# Patient Record
Sex: Female | Born: 1965 | Race: Black or African American | Hispanic: No | Marital: Single | Smoking: Never smoker
Health system: Southern US, Community
[De-identification: ages and names within clinical notes are randomized; demographics above are authoritative.]

---

## 2003-12-23 ENCOUNTER — Emergency Department (HOSPITAL_COMMUNITY): Admission: EM | Admit: 2003-12-23 | Discharge: 2003-12-23 | Payer: Self-pay | Admitting: Emergency Medicine

## 2003-12-27 ENCOUNTER — Emergency Department (HOSPITAL_COMMUNITY): Admission: EM | Admit: 2003-12-27 | Discharge: 2003-12-27 | Payer: Self-pay | Admitting: Emergency Medicine

## 2010-08-14 ENCOUNTER — Emergency Department (HOSPITAL_BASED_OUTPATIENT_CLINIC_OR_DEPARTMENT_OTHER)
Admission: EM | Admit: 2010-08-14 | Discharge: 2010-08-14 | Payer: Self-pay | Source: Home / Self Care | Admitting: Emergency Medicine

## 2010-08-15 ENCOUNTER — Ambulatory Visit (HOSPITAL_COMMUNITY)
Admission: RE | Admit: 2010-08-15 | Discharge: 2010-08-15 | Payer: Self-pay | Source: Home / Self Care | Attending: Emergency Medicine | Admitting: Emergency Medicine

## 2013-01-16 ENCOUNTER — Ambulatory Visit: Payer: Self-pay | Admitting: Podiatry

## 2013-01-16 DIAGNOSIS — M722 Plantar fascial fibromatosis: Secondary | ICD-10-CM | POA: Insufficient documentation

## 2013-01-16 NOTE — Progress Notes (Signed)
Encounter opened by error. Patient failed appointment.

## 2018-06-19 ENCOUNTER — Ambulatory Visit: Payer: Self-pay | Admitting: Podiatry

## 2020-06-20 ENCOUNTER — Other Ambulatory Visit: Payer: Self-pay | Admitting: Internal Medicine

## 2020-06-20 ENCOUNTER — Other Ambulatory Visit: Payer: Self-pay | Admitting: Family

## 2020-06-23 ENCOUNTER — Other Ambulatory Visit: Payer: Self-pay | Admitting: Internal Medicine

## 2020-07-15 ENCOUNTER — Other Ambulatory Visit: Payer: Self-pay | Admitting: Family Medicine

## 2020-08-25 ENCOUNTER — Other Ambulatory Visit: Payer: Self-pay | Admitting: Urology

## 2020-08-26 ENCOUNTER — Emergency Department (HOSPITAL_BASED_OUTPATIENT_CLINIC_OR_DEPARTMENT_OTHER)
Admission: EM | Admit: 2020-08-26 | Discharge: 2020-08-27 | Disposition: A | Discharge status: Home / Self Care | Payer: Managed Care, Other (non HMO) | Attending: Emergency Medicine | Admitting: Emergency Medicine

## 2020-08-26 ENCOUNTER — Encounter (HOSPITAL_BASED_OUTPATIENT_CLINIC_OR_DEPARTMENT_OTHER): Payer: Self-pay | Admitting: Emergency Medicine

## 2020-08-26 ENCOUNTER — Emergency Department (HOSPITAL_BASED_OUTPATIENT_CLINIC_OR_DEPARTMENT_OTHER): Payer: Managed Care, Other (non HMO)

## 2020-08-26 ENCOUNTER — Other Ambulatory Visit: Payer: Self-pay

## 2020-08-26 DIAGNOSIS — R079 Chest pain, unspecified: Secondary | ICD-10-CM | POA: Insufficient documentation

## 2020-08-26 DIAGNOSIS — R202 Paresthesia of skin: Secondary | ICD-10-CM | POA: Diagnosis not present

## 2020-08-26 DIAGNOSIS — R030 Elevated blood-pressure reading, without diagnosis of hypertension: Secondary | ICD-10-CM

## 2020-08-26 DIAGNOSIS — F1290 Cannabis use, unspecified, uncomplicated: Secondary | ICD-10-CM | POA: Diagnosis not present

## 2020-08-26 LAB — CBC WITH DIFFERENTIAL/PLATELET
Abs Immature Granulocytes: 0.05 10*3/uL (ref 0.00–0.07)
Basophils Absolute: 0.1 10*3/uL (ref 0.0–0.1)
Basophils Relative: 0 %
Eosinophils Absolute: 0.1 10*3/uL (ref 0.0–0.5)
Eosinophils Relative: 1 %
HCT: 37.5 % (ref 36.0–46.0)
Hemoglobin: 12 g/dL (ref 12.0–15.0)
Immature Granulocytes: 0 %
Lymphocytes Relative: 41 %
Lymphs Abs: 6.6 10*3/uL — ABNORMAL HIGH (ref 0.7–4.0)
MCH: 26.8 pg (ref 26.0–34.0)
MCHC: 32 g/dL (ref 30.0–36.0)
MCV: 83.9 fL (ref 80.0–100.0)
Monocytes Absolute: 1 10*3/uL (ref 0.1–1.0)
Monocytes Relative: 6 %
Neutro Abs: 8.5 10*3/uL — ABNORMAL HIGH (ref 1.7–7.7)
Neutrophils Relative %: 52 %
Platelets: 331 10*3/uL (ref 150–400)
RBC: 4.47 MIL/uL (ref 3.87–5.11)
RDW: 14.4 % (ref 11.5–15.5)
Smear Review: NORMAL
WBC: 16.3 10*3/uL — ABNORMAL HIGH (ref 4.0–10.5)
nRBC: 0 % (ref 0.0–0.2)

## 2020-08-26 LAB — COMPREHENSIVE METABOLIC PANEL
ALT: 22 U/L (ref 0–44)
AST: 32 U/L (ref 15–41)
Albumin: 4.2 g/dL (ref 3.5–5.0)
Alkaline Phosphatase: 117 U/L (ref 38–126)
Anion gap: 15 (ref 5–15)
BUN: 19 mg/dL (ref 6–20)
CO2: 20 mmol/L — ABNORMAL LOW (ref 22–32)
Calcium: 9 mg/dL (ref 8.9–10.3)
Chloride: 100 mmol/L (ref 98–111)
Creatinine, Ser: 1.16 mg/dL — ABNORMAL HIGH (ref 0.44–1.00)
GFR, Estimated: 56 mL/min — ABNORMAL LOW (ref >60)
Glucose, Bld: 184 mg/dL — ABNORMAL HIGH (ref 70–99)
Potassium: 4.1 mmol/L (ref 3.5–5.1)
Sodium: 135 mmol/L (ref 135–145)
Total Bilirubin: 0.4 mg/dL (ref 0.3–1.2)
Total Protein: 8.2 g/dL — ABNORMAL HIGH (ref 6.5–8.1)

## 2020-08-26 LAB — TROPONIN I (HIGH SENSITIVITY): Troponin I (High Sensitivity): 2 ng/L (ref <18)

## 2020-08-26 MED ORDER — LORAZEPAM 2 MG/ML IJ SOLN
0.5000 mg | Freq: Once | INTRAMUSCULAR | Status: AC
  Administered 2020-08-26: 0.5 mg via INTRAVENOUS
  Filled 2020-08-26: qty 1

## 2020-08-26 NOTE — Discharge Instructions (Addendum)
It was our pleasure to provide your ER care today - we hope that you feel better.  Recommend avoid using earlier edible as that be responsible for your symptoms this evening.   Follow up with primary care doctor in the coming week - also have your blood pressure rechecked as it is high tonight.   Return to ER if worse, new symptoms, increased trouble breathing, recurrent or persistent chest pain, fainting, or other concern.

## 2020-08-26 NOTE — ED Triage Notes (Signed)
Patient arrived via POV c/o chest pain, light headedness at approximately 1900 post edible use. Patient endorses anxiety and "unable to taste". Patient is AO x 4, VS with elevated HR, unstable gait.

## 2020-08-26 NOTE — ED Provider Notes (Signed)
[facility] EMERGENCY DEPARTMENT Provider Note   CSN: [id number] Arrival date & time: 08/26/20  2140     History Chief Complaint  Patient presents with  . Chest Pain    After edible    Maureen Larson is a 55 y.o. female.  Patient c/o feeling funny in chest, numb all over body, having no taste, in past couple hours. States symptoms began shortly after taking an 'edible'.  Symptoms acute onset, moderate, constant, persistent. No hx same. States felt fine, at baseline, earlier today. Had covid ~ 1 month ago, states those symptoms had previously resolved. Denies cough or sore throat. No fever or chills. No other recent exertional chest pain, no recent unusual doe/fatigue. Denies leg pain or swelling. No focal or unilateral numbness/weakness. No change in speech or vision.   The history is provided by the patient.  Chest Pain Associated symptoms: numbness   Associated symptoms: no abdominal pain, no back pain, no cough, no fever, no headache, no palpitations, no shortness of breath and no vomiting        History reviewed. No pertinent past medical history.  Patient Active Problem List   Diagnosis Date Noted  . Plantar fasciitis, bilateral 01/16/2013    History reviewed. No pertinent surgical history.   OB History   No obstetric history on file.     No family history on file.  Social History   Tobacco Use  . Smoking status: Never Smoker  . Smokeless tobacco: Never Used  Substance Use Topics  . Drug use: Yes    Types: Marijuana    Comment: Edibles    Home Medications Prior to Admission medications   Not on File    Allergies    Penicillins  Review of Systems   Review of Systems  Constitutional: Negative for chills and fever.  HENT: Negative for sore throat.   Eyes: Negative for redness.  Respiratory: Negative for cough and shortness of breath.   Cardiovascular: Positive for chest pain. Negative for palpitations and leg swelling.  Gastrointestinal:  Negative for abdominal pain, diarrhea and vomiting.  Genitourinary: Negative for flank pain.  Musculoskeletal: Negative for back pain and neck pain.  Skin: Negative for rash.  Neurological: Positive for numbness. Negative for speech difficulty and headaches.  Hematological: Does not bruise/bleed easily.  Psychiatric/Behavioral: Negative for hallucinations. The patient is nervous/anxious.     Physical Exam Updated Vital Signs BP (!) 169/90 (BP Location: Right Arm)   Pulse (!) 110   Temp 98.7 F (37.1 C) (Oral)   Resp 18   Ht 1.549 m (5\' 1" )   Wt 94.3 kg   SpO2 94%   BMI 39.30 kg/m   Physical Exam Vitals and nursing note reviewed.  Constitutional:      Appearance: Normal appearance. She is well-developed.  HENT:     Head: Atraumatic.     Nose: Nose normal.     Mouth/Throat:     Mouth: Mucous membranes are moist.     Pharynx: Oropharynx is clear.  Eyes:     General: No scleral icterus.    Conjunctiva/sclera: Conjunctivae normal.     Pupils: Pupils are equal, round, and reactive to light.  Neck:     Vascular: No carotid bruit.     Trachea: No tracheal deviation.     Comments: Thyroid not grossly enlarged to tender.  Cardiovascular:     Rate and Rhythm: Normal rate and regular rhythm.     Pulses: Normal pulses.     Heart  sounds: Normal heart sounds. No murmur heard. No friction rub. No gallop.   Pulmonary:     Effort: Pulmonary effort is normal. No respiratory distress.     Breath sounds: Normal breath sounds.  Abdominal:     General: Bowel sounds are normal. There is no distension.     Palpations: Abdomen is soft.     Tenderness: There is no abdominal tenderness.  Genitourinary:    Comments: No cva tenderness.  Musculoskeletal:        General: No swelling or tenderness.     Cervical back: Normal range of motion and neck supple. No rigidity. No muscular tenderness.     Right lower leg: No edema.     Left lower leg: No edema.  Skin:    General: Skin is warm and  dry.     Findings: No rash.  Neurological:     Mental Status: She is alert.     Comments: Anxious appearing. Mild hyperventilation. Keeps eyes closed, but will open spontaneously. Motor/sens grossly intact bil.   Psychiatric:     Comments: Anxious appearing.      ED Results / Procedures / Treatments   Labs (all labs ordered are listed, but only abnormal results are displayed) Results for orders placed or performed during the hospital encounter of 08/26/20  CBC with Differential  Result Value Ref Range   WBC 16.3 (H) 4.0 - 10.5 K/uL   RBC 4.47 3.87 - 5.11 MIL/uL   Hemoglobin 12.0 12.0 - 15.0 g/dL   HCT 97.3 53.2 - 99.2 %   MCV 83.9 80.0 - 100.0 fL   MCH 26.8 26.0 - 34.0 pg   MCHC 32.0 30.0 - 36.0 g/dL   RDW 42.6 83.4 - 19.6 %   Platelets 331 150 - 400 K/uL   nRBC 0.0 0.0 - 0.2 %   Neutrophils Relative % 52 %   Neutro Abs 8.5 (H) 1.7 - 7.7 K/uL   Lymphocytes Relative 41 %   Lymphs Abs 6.6 (H) 0.7 - 4.0 K/uL   Monocytes Relative 6 %   Monocytes Absolute 1.0 0.1 - 1.0 K/uL   Eosinophils Relative 1 %   Eosinophils Absolute 0.1 0.0 - 0.5 K/uL   Basophils Relative 0 %   Basophils Absolute 0.1 0.0 - 0.1 K/uL   RBC Morphology MORPHOLOGY UNREMARKABLE    Smear Review Normal platelet morphology    Immature Granulocytes 0 %   Abs Immature Granulocytes 0.05 0.00 - 0.07 K/uL   Reactive, Benign Lymphocytes PRESENT   Comprehensive metabolic panel  Result Value Ref Range   Sodium 135 135 - 145 mmol/L   Potassium 4.1 3.5 - 5.1 mmol/L   Chloride 100 98 - 111 mmol/L   CO2 20 (L) 22 - 32 mmol/L   Glucose, Bld 184 (H) 70 - 99 mg/dL   BUN 19 6 - 20 mg/dL   Creatinine, Ser 2.22 (H) 0.44 - 1.00 mg/dL   Calcium 9.0 8.9 - 97.9 mg/dL   Total Protein 8.2 (H) 6.5 - 8.1 g/dL   Albumin 4.2 3.5 - 5.0 g/dL   AST 32 15 - 41 U/L   ALT 22 0 - 44 U/L   Alkaline Phosphatase 117 38 - 126 U/L   Total Bilirubin 0.4 0.3 - 1.2 mg/dL   GFR, Estimated 56 (L) >60 mL/min   Anion gap 15 5 - 15  Troponin I  (High Sensitivity)  Result Value Ref Range   Troponin I (High Sensitivity) 2 <18 ng/L   DG  Chest Portable 1 View  Result Date: 08/26/2020 CLINICAL DATA:  Chest pain, lightheadedness, anxiety EXAM: PORTABLE CHEST 1 VIEW COMPARISON:  None. FINDINGS: The heart size and mediastinal contours are within normal limits. Both lungs are clear. The visualized skeletal structures are unremarkable. IMPRESSION: No active disease. Electronically Signed   By: Sharlet Salina M.D.   On: 08/26/2020 22:10     EKG EKG Interpretation  Date/Time:  Tuesday August 26 2020 21:54:02 EST Ventricular Rate:  123 PR Interval:    QRS Duration: 80 QT Interval:  313 QTC Calculation: 448 R Axis:   35 Text Interpretation: Sinus tachycardia Low voltage, precordial leads Nonspecific T wave abnormality No previous tracing Confirmed by Cathren Laine ([id number]) on 08/26/2020 10:19:37 PM   Radiology DG Chest Portable 1 View  Result Date: 08/26/2020 CLINICAL DATA:  Chest pain, lightheadedness, anxiety EXAM: PORTABLE CHEST 1 VIEW COMPARISON:  None. FINDINGS: The heart size and mediastinal contours are within normal limits. Both lungs are clear. The visualized skeletal structures are unremarkable. IMPRESSION: No active disease. Electronically Signed   By: Sharlet Salina M.D.   On: 08/26/2020 22:10    Procedures Procedures   Medications Ordered in ED Medications  LORazepam (ATIVAN) injection 0.5 mg (0.5 mg Intravenous Given 08/26/20 2202)    ED Course  I have reviewed the triage vital signs and the nursing notes.  Pertinent labs & imaging results that were available during my care of the patient were reviewed by me and considered in my medical decision making (see chart for details).    MDM Rules/Calculators/A&P                         Iv ns. Continuous pulse ox and cardiac monitoring. Stat labs.   Reviewed nursing notes and prior charts for additional history.   CXR reviewed/interpreted by me - no pna.  Labs  reviewed/interpreted by me - hgb normal.   Pt anxious. Reassurance provided. Ativan .5 mg iv.   Recheck feels improved. Initial trop normal, delta trop pending.  Signed out to Dr Read Drivers to recheck pt, check delta trop and dispo appropriately. Feel likely if patient continued improved and delta trop normal/not increasing that pt will be stable for d/c. PCP f/u re hypertension, and earlier symptoms.      Final Clinical Impression(s) / ED Diagnoses Final diagnoses:  None    Rx / DC Orders ED Discharge Orders    None       Cathren Laine, MD 08/26/20 2318

## 2020-08-27 LAB — TROPONIN I (HIGH SENSITIVITY): Troponin I (High Sensitivity): 3 ng/L (ref <18)

## 2021-01-01 LAB — HM PAP SMEAR: HM Pap smear: NEGATIVE

## 2021-04-22 LAB — HM MAMMOGRAPHY

## 2021-04-27 ENCOUNTER — Other Ambulatory Visit: Payer: Self-pay | Admitting: Obstetrics and Gynecology

## 2021-04-27 DIAGNOSIS — R928 Other abnormal and inconclusive findings on diagnostic imaging of breast: Secondary | ICD-10-CM

## 2021-04-28 ENCOUNTER — Ambulatory Visit
Admission: RE | Admit: 2021-04-28 | Discharge: 2021-04-28 | Disposition: A | Discharge status: Home / Self Care | Payer: Managed Care, Other (non HMO) | Source: Ambulatory Visit | Attending: Obstetrics and Gynecology | Admitting: Obstetrics and Gynecology

## 2021-04-28 DIAGNOSIS — R928 Other abnormal and inconclusive findings on diagnostic imaging of breast: Secondary | ICD-10-CM

## 2021-05-19 HISTORY — PX: CERVICAL BIOPSY: SHX590

## 2022-03-03 ENCOUNTER — Ambulatory Visit: Payer: Managed Care, Other (non HMO) | Admitting: Family Medicine

## 2022-03-15 LAB — VITAMIN D 25 HYDROXY (VIT D DEFICIENCY, FRACTURES): Vit D, 25-Hydroxy: 17.7

## 2022-03-15 LAB — HEMOGLOBIN A1C: Hemoglobin A1C: 6.3

## 2022-03-30 ENCOUNTER — Ambulatory Visit (INDEPENDENT_AMBULATORY_CARE_PROVIDER_SITE_OTHER): Payer: Managed Care, Other (non HMO) | Admitting: Family

## 2022-03-30 DIAGNOSIS — E669 Obesity, unspecified: Secondary | ICD-10-CM

## 2022-04-16 ENCOUNTER — Encounter: Payer: Self-pay | Admitting: Family

## 2022-04-16 ENCOUNTER — Ambulatory Visit (INDEPENDENT_AMBULATORY_CARE_PROVIDER_SITE_OTHER): Payer: Managed Care, Other (non HMO) | Admitting: Family

## 2022-04-16 ENCOUNTER — Telehealth: Payer: Self-pay | Admitting: Family

## 2022-04-16 VITALS — BP 112/70 | HR 99 | Temp 97.8°F | Ht 61.0 in | Wt 210.4 lb

## 2022-04-16 DIAGNOSIS — R7303 Prediabetes: Secondary | ICD-10-CM | POA: Diagnosis not present

## 2022-04-16 DIAGNOSIS — E669 Obesity, unspecified: Secondary | ICD-10-CM | POA: Diagnosis not present

## 2022-04-16 DIAGNOSIS — Z1211 Encounter for screening for malignant neoplasm of colon: Secondary | ICD-10-CM | POA: Diagnosis not present

## 2022-04-16 NOTE — Telephone Encounter (Signed)
Please let her know that we did get her labs back from her GYN;  She is pre-diabetic and I do think the weight loss program will be beneficial for her. We could consider trial of Metformin to help with her blood sugar and try to jump start her weight loss goals.  If for some reason she does not start the weight loss program, would recommend follow up in 4-6 months.

## 2022-04-16 NOTE — Progress Notes (Signed)
  Maureen Larson is a 56 y.o. female with the following history as recorded in Epic[facility]:  Patient Active Problem List   Diagnosis Date Noted   Plantar fasciitis, bilateral 01/16/2013    Current Outpatient Medications  Medication Sig Dispense Refill   Multiple Vitamin (MULTIVITAMIN) capsule Take 1 capsule by mouth daily.     Vitamin D, Ergocalciferol, (DRISDOL) 1.25 MG (50000 UNIT) CAPS capsule Take 50,000 Units by mouth once a week.     No current facility-administered medications for this visit.    Allergies: Penicillins  No past medical history on file.  No past surgical history on file.  No family history on file.  Social History   Tobacco Use   Smoking status: Never   Smokeless tobacco: Never  Substance Use Topics   Alcohol use: Not on file    Subjective:   Presents today as a new patient; concerned about her weight; notes that GYN told her in the last month that she was pre-diabetic- does not have her labs available today;  Works 2 jobs- FT at [employer]; [employer] 7-10:30;  May get 7 hours sleep/ night; admits that diet is not "good." Not able to exercise due to extended work hours;      Objective:  Vitals:   04/16/22 1031  BP: 112/70  Pulse: 99  Temp: 97.8 F (36.6 C)  TempSrc: Oral  SpO2: 98%  Weight: 210 lb 6.4 oz (95.4 kg)  Height: 5\' 1"  (1.549 m)    General: Well developed, well nourished, in no acute distress  Skin : Warm and dry.  Head: Normocephalic and atraumatic  Eyes: Sclera and conjunctiva clear; pupils round and reactive to light; extraocular movements intact  Ears: External normal; canals clear; tympanic membranes normal  Oropharynx: Pink, supple. No suspicious lesions  Neck: Supple without thyromegaly, adenopathy  Lungs: Respirations unlabored; clear to auscultation bilaterally without wheeze, rales, rhonchi  CVS exam: normal rate and regular rhythm.  Neurologic: Alert and oriented; speech intact; face symmetrical; moves all  extremities well; CNII-XII intact without focal deficit   Assessment:  1. Obesity, Class II, BMI 35-39.9   2. Pre-diabetes   3. Encounter for screening colonoscopy     Plan:   Refer to healthy weight and wellness- with patient's work schedule, she would benefit from specialized eating plan/ support; will get labs from recent GYN visit- to consider trial of Metformin; follow up to be determined;  Order for screening mammogram updated;   No follow-ups on file.  Orders Placed This Encounter  Procedures   Amb Ref to Medical Weight Management    Referral Priority:   Routine    Referral Type:   Consultation    Number of Visits Requested:   1   Ambulatory referral to Gastroenterology    Referral Priority:   Routine    Referral Type:   Consultation    Referral Reason:   Specialty Services Required    Number of Visits Requested:   1    Requested Prescriptions    No prescriptions requested or ordered in this encounter

## 2022-04-16 NOTE — Progress Notes (Unsigned)
Labs, Mammogram, and  PAP has been abstracted.

## 2022-04-16 NOTE — Telephone Encounter (Signed)
I have called the pt and confirmed that she does want to try the weight loss program. She stated understanding about the other results.

## 2022-04-19 NOTE — Progress Notes (Signed)
This is addendum to note for 04/16/2022

## 2022-05-18 ENCOUNTER — Encounter (INDEPENDENT_AMBULATORY_CARE_PROVIDER_SITE_OTHER): Payer: Self-pay

## 2022-06-15 ENCOUNTER — Ambulatory Visit: Payer: Managed Care, Other (non HMO) | Admitting: Nurse Practitioner

## 2022-06-22 ENCOUNTER — Encounter: Payer: Self-pay | Admitting: Gastroenterology

## 2022-07-29 ENCOUNTER — Ambulatory Visit: Payer: Managed Care, Other (non HMO) | Admitting: *Deleted

## 2022-07-29 VITALS — Ht 61.0 in | Wt 220.0 lb

## 2022-07-29 DIAGNOSIS — Z1211 Encounter for screening for malignant neoplasm of colon: Secondary | ICD-10-CM

## 2022-07-29 MED ORDER — NA SULFATE-K SULFATE-MG SULF 17.5-3.13-1.6 GM/177ML PO SOLN: 1.0000 | Freq: Once | ORAL | 0 refills | Status: AC

## 2022-07-29 NOTE — Progress Notes (Signed)
No egg or soy allergy known to patient  No issues moving head or neck No issues with swallowing No FH of Malignant Hyperthermia Pt is not on diet pills Pt is not on  home 02  Pt is not on blood thinners  Pt denies issues with constipation  Pt is not on dialysis Pt denies any upcoming cardiac testing Pt encouraged to use to use Singlecare or Goodrx to reduce cost  Patient's chart reviewed by Osvaldo Angst CNRA prior to previsit and patient appropriate for the [place].  Previsit completed and red dot placed by patient's name on their procedure day (on provider's schedule).  . Visit done by phone Instructions sent by mail

## 2022-08-13 ENCOUNTER — Encounter: Payer: Self-pay | Admitting: Gastroenterology

## 2022-08-20 ENCOUNTER — Encounter: Payer: Managed Care, Other (non HMO) | Admitting: Gastroenterology

## 2022-08-31 ENCOUNTER — Encounter: Payer: Self-pay | Admitting: Family Medicine

## 2022-08-31 ENCOUNTER — Ambulatory Visit (INDEPENDENT_AMBULATORY_CARE_PROVIDER_SITE_OTHER): Payer: Managed Care, Other (non HMO) | Admitting: Family Medicine

## 2022-08-31 VITALS — BP 136/79 | HR 113 | Temp 98.5°F | Resp 16 | Ht 61.0 in | Wt 206.8 lb

## 2022-08-31 DIAGNOSIS — Z1152 Encounter for screening for COVID-19: Secondary | ICD-10-CM | POA: Diagnosis not present

## 2022-08-31 DIAGNOSIS — J069 Acute upper respiratory infection, unspecified: Secondary | ICD-10-CM

## 2022-08-31 LAB — POC COVID19 BINAXNOW: SARS Coronavirus 2 Ag: NEGATIVE

## 2022-08-31 NOTE — Progress Notes (Signed)
Acute Office Visit  Subjective:     Patient ID: Maureen Larson, female    DOB: [birth year], 57 y.o.   MRN: [id number]  Chief Complaint  Patient presents with   Cough    dry   Nasal Congestion     Patient is in today for URI symptoms.   Patient states that on Friday 2/9 she started with sore/dry throat. She tried hot tea and cough drops. The next morning she woke up with a dry cough. She went to urgent care and they sent of COVID/Flu PCR which came back negative the following day. States she is still struggling with productive cough with clear sputum, postnasal drainage, rhinorrhea, ear discomfort, headaches, back soreness with coughing. She denies any fevers, chills, body aches, nausea, vomiting, diarrhea, GU changes, chest pain, dyspnea, sinus pressure. She has been getting minimal improvement with promethazine DM and tessalon.      All review of systems negative except what is listed in the HPI      Objective:    BP 136/79   Pulse (!) 113   Temp 98.5 F (36.9 C)   Resp 16   Ht 5' 1"$  (1.549 m)   Wt 206 lb 12.8 oz (93.8 kg)   SpO2 98%   BMI 39.07 kg/m    Physical Exam Vitals reviewed.  Constitutional:      Appearance: Normal appearance.  HENT:     Head: Normocephalic and atraumatic.     Right Ear: Tympanic membrane normal.     Left Ear: Tympanic membrane normal.     Nose: Congestion present.     Mouth/Throat:     Mouth: Mucous membranes are moist.     Pharynx: Oropharynx is clear. No oropharyngeal exudate or posterior oropharyngeal erythema.  Cardiovascular:     Rate and Rhythm: Normal rate and regular rhythm.     Pulses: Normal pulses.     Heart sounds: Normal heart sounds.  Pulmonary:     Effort: Pulmonary effort is normal.     Breath sounds: Normal breath sounds.  Skin:    General: Skin is warm and dry.  Neurological:     Mental Status: She is alert and oriented to person, place, and time.  Psychiatric:        Mood and Affect: Mood normal.         Behavior: Behavior normal.        Thought Content: Thought content normal.        Judgment: Judgment normal.     Results for orders placed or performed in visit on 08/31/22  POC COVID-19  Result Value Ref Range   SARS Coronavirus 2 Ag Negative Negative        Assessment & Plan:   Problem List Items Addressed This Visit   None Visit Diagnoses     Viral URI with cough    -  Primary   Encounter for screening for COVID-19       Relevant Orders   POC COVID-19 (Completed)     COVID negative. Likely another virus.  Continue supportive measures including rest, hydration, humidifier use, steam showers, warm compresses to sinuses, warm liquids with lemon and honey, and over-the-counter cough, cold, and analgesics as needed.   Please contact office for follow-up if symptoms do not improve or worsen. Seek emergency care if symptoms become severe.    No orders of the defined types were placed in this encounter.   Return if symptoms worsen or fail to improve.  Lovena Le  Barbie Banner, NP

## 2022-08-31 NOTE — Patient Instructions (Addendum)
COVID negative. Likely another virus.  Continue supportive measures including rest, hydration, humidifier use, steam showers, warm compresses to sinuses, warm liquids with lemon and honey, and over-the-counter cough, cold, and analgesics as needed.   Please contact office for follow-up if symptoms do not improve or worsen. Seek emergency care if symptoms become severe.    Over the counter medications that may be helpful for symptoms:  Guaifenesin 1200 mg extended release tabs twice daily, with plenty of water For cough and congestion Brand name: Mucinex   Pseudoephedrine 30 mg, one or two tabs every 4 to 6 hours For sinus congestion Brand name: Sudafed You must get this from the pharmacy counter.  Oxymetazoline nasal spray each morning, one spray in each nostril, for NO MORE THAN 3 days  For nasal and sinus congestion Brand name: Afrin Saline nasal spray or Saline Nasal Irrigation 3-5 times a day For nasal and sinus congestion Brand names: Ocean or AYR Fluticasone nasal spray, one spray in each nostril, each morning (after oxymetazoline and saline, if used) For nasal and sinus congestion Brand name: Flonase Warm salt water gargles  For sore throat Every few hours as needed Alternate ibuprofen 400-600 mg and acetaminophen 1000 mg every 4-6 hours For fever, body aches, headache Brand names: Motrin or Advil and Tylenol Dextromethorphan 12-hour cough version 30 mg every 12 hours  For cough Brand name: Delsym Stop all other cold medications for now (Nyquil, Dayquil, Tylenol Cold, Theraflu, etc) and other non-prescription cough/cold preparations. Many of these have the same ingredients listed above and could cause an overdose of medication.   Herbal treatments that have been shown to be helpful in some patients include: Vitamin C 1086m per day Vitamin D 4000iU per day Zinc 1063mper day Quercetin 25-50064mwice a day Melatonin 5-10mg at bedtime  General Instructions Allow your  body to rest Drink PLENTY of fluids Isolate yourself from everyone, even family, until test results have returned   If you develop severe shortness of breath, uncontrolled fevers, coughing up blood, confusion, chest pain, or signs of dehydration (such as significantly decreased urine amounts or dizziness with standing) please go to the ER.

## 2022-09-05 IMAGING — MG MM DIGITAL DIAGNOSTIC UNILAT*L* W/ TOMO W/ CAD
4 series · 4 of 12 positions shown · non-contrast
Comparison: Previous exam(s).
COMPARISON: Previous exam(s).

Addendum:
CLINICAL DATA: Screening recall for a left breast asymmetry.

EXAM:
DIGITAL DIAGNOSTIC UNILATERAL LEFT MAMMOGRAM WITH TOMOSYNTHESIS AND
CAD; ULTRASOUND LEFT BREAST LIMITED
TECHNIQUE: Left digital diagnostic mammography and breast tomosynthesis was
performed. The images were evaluated with computer-aided detection.;
Targeted ultrasound examination of the left breast was performed.

[L CC synth-2D]
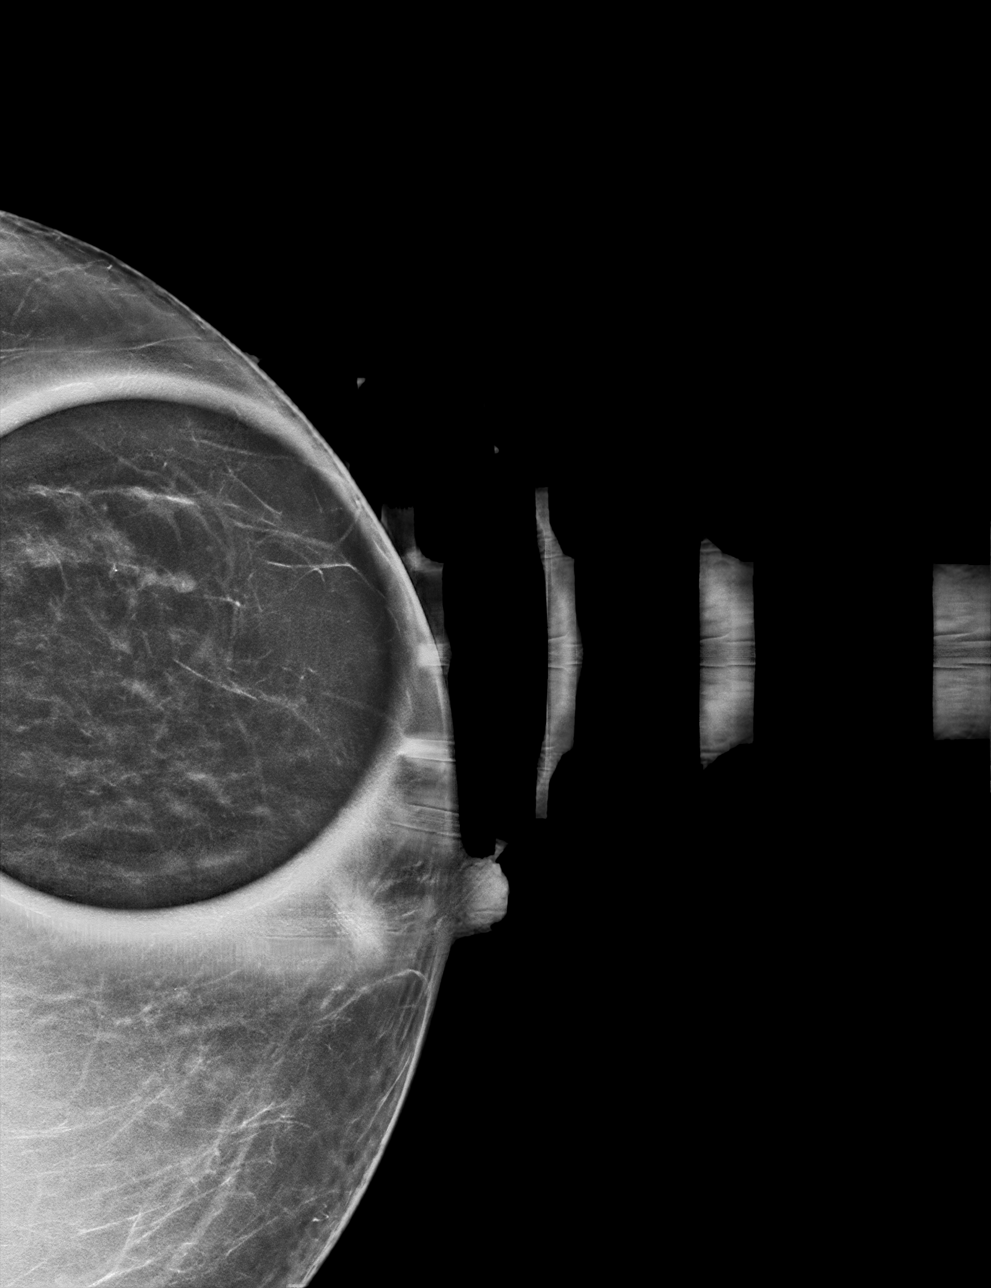

[L MLO synth-2D]
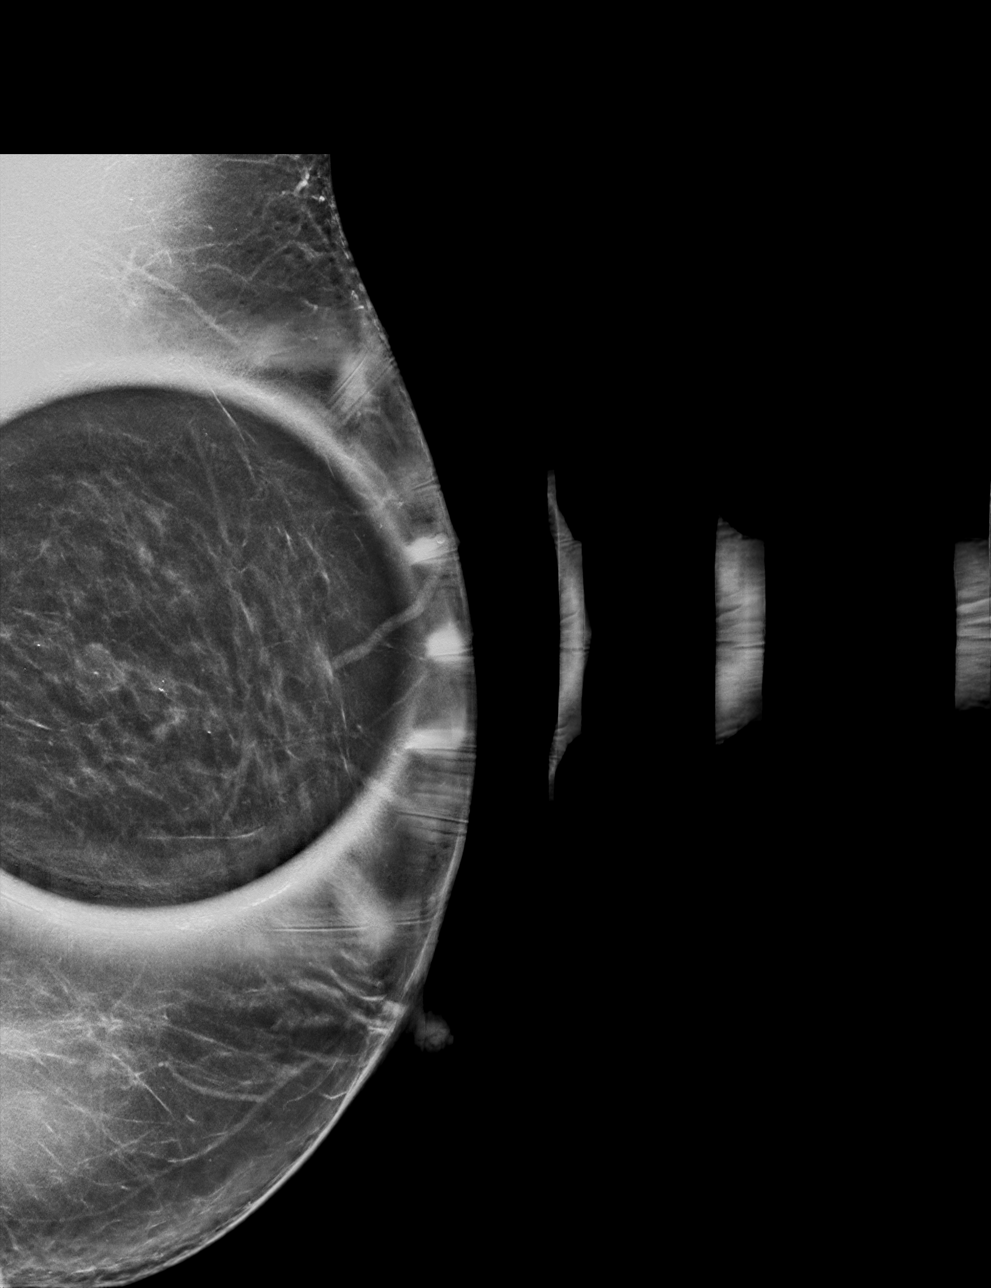

[L MLO tomo · tomo slice 39/76.0]
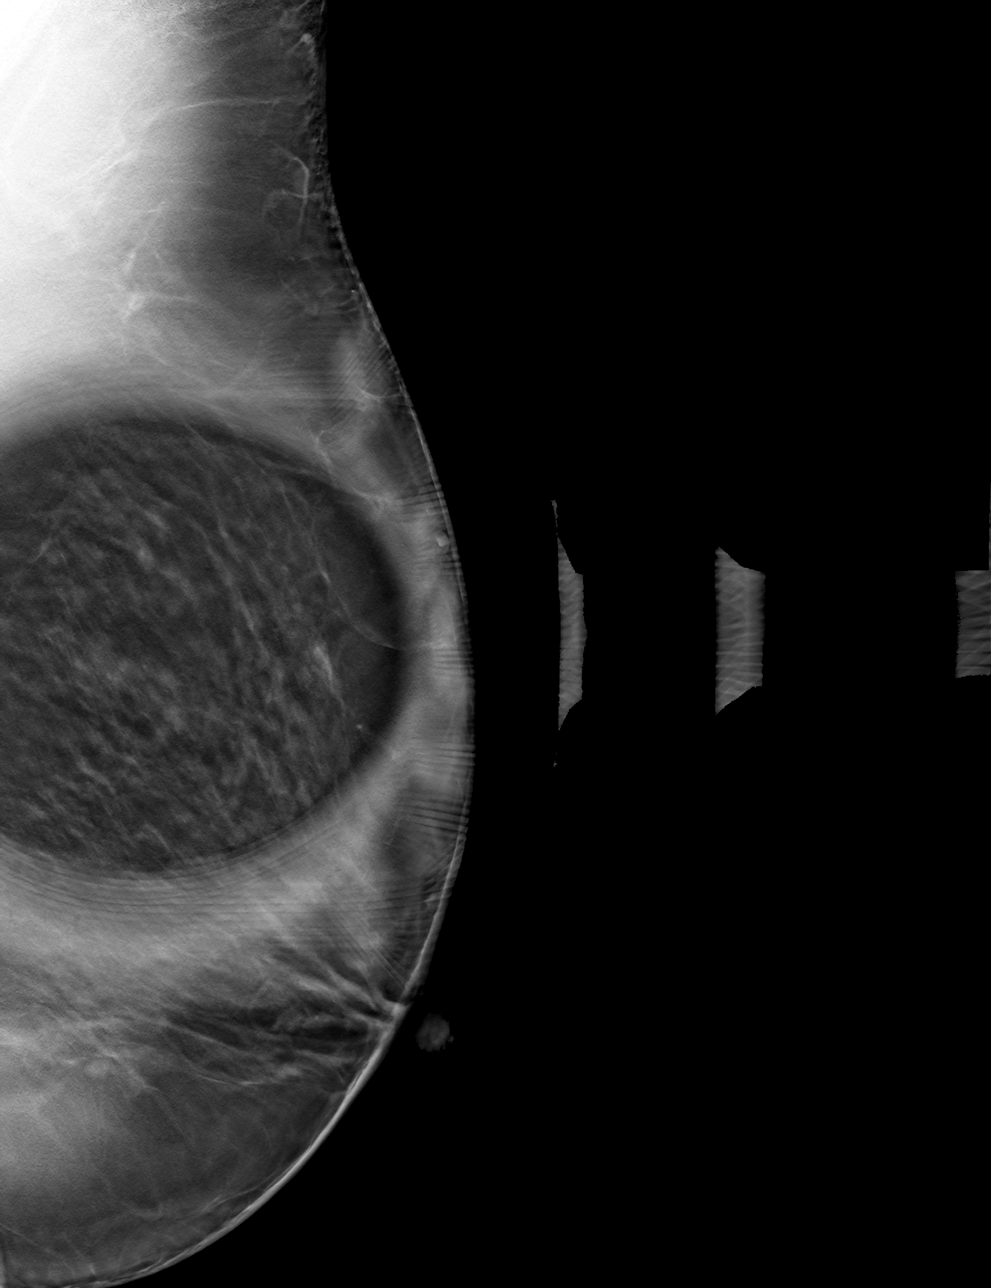

[L CC tomo · tomo slice 33/66.0]
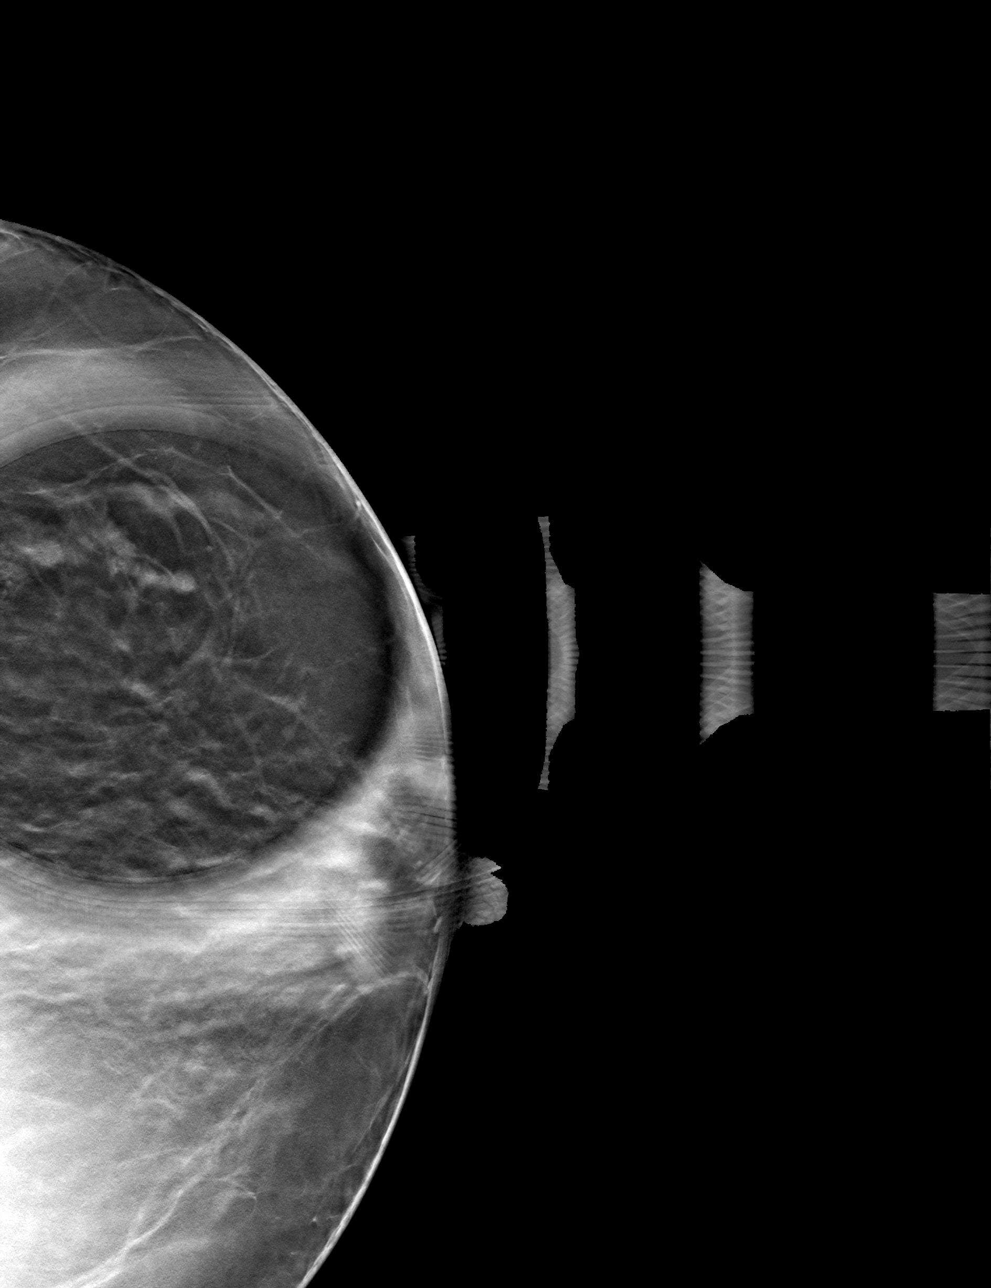

[4 of 12 positions shown; findings below may reference images not displayed]

ACR Breast Density Category b: There are scattered areas of
fibroglandular density.
FINDINGS: Spot compression tomosynthesis images through the lateral to upper
outer middle depth of the right breast demonstrates clustered oval
circumscribed masses, all together spanning approximately 3 cm.
There are a few calcifications within these masses which layer or
are seen dependent within the mass on the lateral view, compatible
with fibrocystic changes.

Ultrasound targeted to the left breast at 2 o'clock, 5 cm from the
nipple demonstrates a few small anechoic oval masses in a row
spanning 2.4 cm, consistent with fibrocystic changes.
IMPRESSION: The asymmetry in the left breast corresponds with benign fibrocystic
changes.

RECOMMENDATION:
Screening mammogram in one year.(Code:G3-R-EKD)

I have discussed the findings and recommendations with the patient.
If applicable, a reminder letter will be sent to the patient
regarding the next appointment.

BI-RADS CATEGORY  2: Benign.

ADDENDUM:
Correction to the findings section: The first sentence should read
spot compression tomosynthesis images through the lateral to upper
outer middle depth of the LEFT breast demonstrates clustered oval
circumscribed masses, all together spanning approximately 3 cm.

*** End of Addendum ***
ACR Breast Density Category b: There are scattered areas of
fibroglandular density.
FINDINGS: Spot compression tomosynthesis images through the lateral to upper
outer middle depth of the right breast demonstrates clustered oval
circumscribed masses, all together spanning approximately 3 cm.
There are a few calcifications within these masses which layer or
are seen dependent within the mass on the lateral view, compatible
with fibrocystic changes.

Ultrasound targeted to the left breast at 2 o'clock, 5 cm from the
nipple demonstrates a few small anechoic oval masses in a row
spanning 2.4 cm, consistent with fibrocystic changes.
IMPRESSION: The asymmetry in the left breast corresponds with benign fibrocystic
changes.

RECOMMENDATION:
Screening mammogram in one year.(Code:G3-R-EKD)

I have discussed the findings and recommendations with the patient.
If applicable, a reminder letter will be sent to the patient
regarding the next appointment.

BI-RADS CATEGORY  2: Benign.

## 2023-01-04 ENCOUNTER — Encounter: Payer: Self-pay | Admitting: Family

## 2023-01-04 DIAGNOSIS — Z1231 Encounter for screening mammogram for malignant neoplasm of breast: Secondary | ICD-10-CM

## 2023-01-12 ENCOUNTER — Ambulatory Visit: Payer: Managed Care, Other (non HMO) | Admitting: Family Medicine

## 2023-04-11 LAB — LAB REPORT - SCANNED: A1c: 6.5

## 2023-04-14 ENCOUNTER — Telehealth: Payer: Self-pay | Admitting: Family

## 2023-04-14 NOTE — Telephone Encounter (Signed)
She is due for yearly follow up; recent Hgba1c done at GYN indicates new onset diabetes; would she like referral to diabetes educator?

## 2023-04-15 NOTE — Telephone Encounter (Signed)
Called pt and left a VM asking pt to call the office back to get a yearly follow up appointment scheduled.

## 2023-04-20 NOTE — Telephone Encounter (Signed)
Called pt and left a VM asking pt to call the office back to schedule a yearly follow up appointment.
